# Patient Record
Sex: Female | Born: 1970 | Race: White | Hispanic: No | Marital: Married | Smoking: Former smoker
Health system: Southern US, Community
[De-identification: ages and names within clinical notes are randomized; demographics above are authoritative.]

---

## 1998-10-01 ENCOUNTER — Other Ambulatory Visit: Admission: RE | Admit: 1998-10-01 | Discharge: 1998-10-01 | Payer: Self-pay | Admitting: Family Medicine

## 1999-11-02 ENCOUNTER — Other Ambulatory Visit: Admission: RE | Admit: 1999-11-02 | Discharge: 1999-11-02 | Payer: Self-pay | Admitting: Obstetrics and Gynecology

## 2000-11-09 ENCOUNTER — Other Ambulatory Visit: Admission: RE | Admit: 2000-11-09 | Discharge: 2000-11-09 | Payer: Self-pay | Admitting: Obstetrics and Gynecology

## 2000-11-26 ENCOUNTER — Ambulatory Visit (HOSPITAL_COMMUNITY): Admission: RE | Admit: 2000-11-26 | Discharge: 2000-11-26 | Payer: Self-pay | Admitting: Obstetrics and Gynecology

## 2000-11-26 ENCOUNTER — Encounter (INDEPENDENT_AMBULATORY_CARE_PROVIDER_SITE_OTHER): Payer: Self-pay

## 2001-11-10 ENCOUNTER — Other Ambulatory Visit: Admission: RE | Admit: 2001-11-10 | Discharge: 2001-11-10 | Payer: Self-pay | Admitting: Obstetrics and Gynecology

## 2002-11-21 ENCOUNTER — Other Ambulatory Visit: Admission: RE | Admit: 2002-11-21 | Discharge: 2002-11-21 | Payer: Self-pay | Admitting: Obstetrics and Gynecology

## 2004-02-13 ENCOUNTER — Other Ambulatory Visit: Admission: RE | Admit: 2004-02-13 | Discharge: 2004-02-13 | Payer: Self-pay | Admitting: Obstetrics and Gynecology

## 2004-06-11 ENCOUNTER — Other Ambulatory Visit: Admission: RE | Admit: 2004-06-11 | Discharge: 2004-06-11 | Payer: Self-pay | Admitting: Obstetrics and Gynecology

## 2005-04-19 ENCOUNTER — Other Ambulatory Visit: Admission: RE | Admit: 2005-04-19 | Discharge: 2005-04-19 | Payer: Self-pay | Admitting: Obstetrics and Gynecology

## 2007-12-27 ENCOUNTER — Emergency Department (HOSPITAL_COMMUNITY): Admission: EM | Admit: 2007-12-27 | Discharge: 2007-12-27 | Payer: Self-pay | Admitting: Emergency Medicine

## 2008-01-12 ENCOUNTER — Encounter: Admission: RE | Admit: 2008-01-12 | Discharge: 2008-01-12 | Payer: Self-pay | Admitting: Chiropractic Medicine
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
## 2010-12-18 NOTE — H&P (Signed)
[facility]  Patient:    Grace Watson, Grace Watson                        MRN: [id number] Attending:  Trevor Iha, M.D.                         History and Physical  DATE OF BIRTH:                [birth year]  HISTORY OF PRESENT ILLNESS:   Ms. Latulippe is a 40 year old, G3, P3 with worsening left lower quadrant pain and a history of recurrent left ovarian cyst. Recent ultrasound on November 09, 2000, shows a 4.3 x 3.8 x 4.2 cm ovarian cyst with internal echoes suggestive of an endometrioma. She presents for definitive surgical evaluation and treatment. She has been tried on oral contraceptive agents and has had a tubal ligation back in 1993.  PAST MEDICAL HISTORY:         Negative.  PAST SURGICAL HISTORY:        A tubal ligation in 1993.  CURRENT MEDICATIONS:          Anti-inflammatories as needed.  ALLERGIES:                    No known drug allergies.  PHYSICAL EXAMINATION:  VITAL SIGNS:                  Blood pressure is 100/70.  HEART:                        Regular rate and rhythm.  LUNGS:                        Clear to auscultation bilaterally.  ABDOMEN:                      Nondistended, nontender.  PELVIC:                       Left adnexal fullness and tenderness to palpation. Uterus is anteverted, mobile.  IMPRESSION AND PLAN:          Left ovarian cyst which has been recurrent with left lower quadrant pain and discomfort suggestive of endometrioma. Plan laparoscopy for evaluation of the pelvic pain with CO2 laser standby for probable lysis of adhesions versus endometriosis ablation. If this does appear to involve most of the left ovary and the right ovary appears normal, plan left salpingo-oophorectomy. We will try to preserve the right ovary. Risks and benefits were discussed at length, including, but not limited to, risk of infection, bleeding, damage to bowel or bladder, uterus, tubes, ovaries, possibility of recurrent ovarian  cyst or endometriosis, possibility that this may not alleviate the pain. Risks associated with blood transfusion were also discussed. The patient does give her informed consent.DD:  11/25/00 TD:  11/25/00 Job: [id number]

## 2010-12-18 NOTE — Op Note (Signed)
[facility]  Patient:    Grace Watson, Grace Watson                      MRN: [id number] Proc. Date: 11/26/00 Adm. Date:  [id number] Attending:  Trevor Iha                           Operative Report  PREOPERATIVE DIAGNOSES:       1. Pelvic pain.                               2. Left ovarian cyst.  POSTOPERATIVE DIAGNOSES:      1. Pelvic pain.                               2. Left ovarian cyst.                               3. Right paratubal cyst and adhesions.                               4. Probable left endometrioma.  PROCEDURE:                    1. Laparoscopy with left salpingo-oophorectomy.                               2. Right salpingectomy.                               3. Lysis of adhesions.  SURGEON:                      Trevor Iha, M.D.  ANESTHESIA:                   General endotracheal.  ESTIMATED BLOOD LOSS:         20 cc.  INDICATIONS:                  Ms. Morford is a 40 year old with worsening pelvic pain. Ultrasound consistent with a left ovarian cyst. She has had recurrent left ovarian cyst in the past. Because of persistent pain and left ovarian cyst, she presents today for definitive surgical intervention. She has previously had bilateral tubal ligation and we are planning on removing a left tube and ovary. Risk and benefits were discussed. Informed consent was obtained.  FINDINGS:                     A large left ovarian cyst consistent with an endometrioma, adhesions to the omentum and bowel from the left tube and ovary, right paratubal cyst; otherwise normal appearing uterus, liver. The appendix was not seen.  DESCRIPTION OF PROCEDURE:     After adequate analgesia, the patient was placed in the dorsal lithotomy position. She was sterilely prepped and draped. The bladder was sterilely drained. A Hulka tenaculum is placed on the anterior lip of the cervix. A 1 cm infraumbilical skin incision was made. The  Veress needle was inserted. The abdomen was insufflated to dullness percussion. A  5 mm trocar was inserted to the left of the midline two fingerbreadths above the pubic symphysis under direct visualization. This was converted to a 12 mm trocar under direct visualization. Some bleeding around the trocar site was cauterized with bipolar cautery. At this time, bipolar cautery was used to cauterize the right fallopian tube and mesosalpinx was sharply excised with EndoShears. The adhesions from the left tube and ovary were sharply dissected and Bovie cautery was used to cauterize the omentum and pelvic sidewall from where the adhesions and the left ovarian tubal complex adhesions were dissected. Care was taken to avoid the bowel and pelvic sidewall and blood vessels. At this time, bipolar cautery was used across the infundibulopelvic ligament and across the utero-ovarian ligament. EndoShears were used to sharply excise the left ovary and tube, and bipolar cautery was used across the base of the pedicle to assure hemostasis, and good hemostasis at this time was achieved. At this time, an Endocatch bag was placed through the 12 mm trocar site. The tube and ovary, as well as the right tube, were placed in the bag. At this time, the tube and ovary were not able to pass through the incision and the bad did rupture, and leaked a small amount of endometriosis-appearing fluid into the cul-de-sac. At this time, copious amount of irrigation was applied and after the endometrioma was drained, a second Endocatch bag was placed and was delivered easily through the incision. After a copious amount of irrigation, adequate hemostasis was assured and no residual endometriosis cystic fluid was identified. The trocar was removed. Reexamination of the trocar site revealed good hemostasis. The umbilical ______ trocar was removed as well. The fascia on both incisions were closed with figure-of-eights of 0 Vicryl. The  skin was closed with 3-0 Vicryl Rapide subcuticular stitch. The incisions were injected with 0.25% Marcaine. The tenaculum was removed from the cervix, noted to be hemostatic. The patient tolerated the procedure well and was stable and transferred to recovery room. Sponge, needle, and instrument count was normal x 3.  DISPOSITION:                  The patient was discharged home with routine instructions for laparoscopy. She will follow up in the office in two to three weeks. She is sent home with a prescription for Darvocet, #20. DD:  11/26/00 TD:  11/26/00 Job: [id number]

## 2012-03-22 ENCOUNTER — Ambulatory Visit: Payer: 59

## 2012-03-22 ENCOUNTER — Ambulatory Visit (INDEPENDENT_AMBULATORY_CARE_PROVIDER_SITE_OTHER): Payer: 59 | Admitting: Emergency Medicine

## 2012-03-22 VITALS — BP 120/74 | HR 84 | Temp 98.6°F | Resp 16 | Ht 61.5 in | Wt 153.8 lb

## 2012-03-22 DIAGNOSIS — R079 Chest pain, unspecified: Secondary | ICD-10-CM

## 2012-03-22 DIAGNOSIS — R109 Unspecified abdominal pain: Secondary | ICD-10-CM

## 2012-03-22 LAB — POCT UA - MICROSCOPIC ONLY
Crystals, Ur, HPF, POC: NEGATIVE
Yeast, UA: NEGATIVE

## 2012-03-22 LAB — POCT URINALYSIS DIPSTICK
Bilirubin, UA: NEGATIVE
Glucose, UA: NEGATIVE
Ketones, UA: 15
Spec Grav, UA: 1.01

## 2012-03-22 LAB — POCT CBC
HCT, POC: 46.4 % (ref 37.7–47.9)
Hemoglobin: 14.2 g/dL (ref 12.2–16.2)
MCH, POC: 28.6 pg (ref 27–31.2)
MCV: 93.4 fL (ref 80–97)
MID (cbc): 0.5 (ref 0–0.9)
RBC: 4.97 M/uL (ref 4.04–5.48)
WBC: 9.4 10*3/uL (ref 4.6–10.2)

## 2012-03-22 MED ORDER — MELOXICAM 15 MG PO TABS
15.0000 mg | ORAL_TABLET | Freq: Every day | ORAL | Status: AC
Start: 2012-03-22 — End: 2013-03-22

## 2012-03-22 MED ORDER — CYCLOBENZAPRINE HCL 10 MG PO TABS: ORAL_TABLET | ORAL | Status: AC

## 2012-03-22 NOTE — Patient Instructions (Addendum)
Chest Wall Pain Chest wall pain is pain in or around the bones and muscles of your chest. It may take up to 6 weeks to get better. It may take longer if you must stay physically active in your work and activities.  CAUSES  Chest wall pain may happen on its own. However, it may be caused by:  A viral illness like the flu.   Injury.   Coughing.   Exercise.   Arthritis.   Fibromyalgia.   Shingles.  HOME CARE INSTRUCTIONS   Avoid overtiring physical activity. Try not to strain or perform activities that cause pain. This includes any activities using your chest or your abdominal and side muscles, especially if heavy weights are used.   Put ice on the sore area.   Put ice in a plastic bag.   Place a towel between your skin and the bag.   Leave the ice on for 15 to 20 minutes per hour while awake for the first 2 days.   Only take over-the-counter or prescription medicines for pain, discomfort, or fever as directed by your caregiver.  SEEK IMMEDIATE MEDICAL CARE IF:   Your pain increases, or you are very uncomfortable.   You have a fever.   Your chest pain becomes worse.   You have new, unexplained symptoms.   You have nausea or vomiting.   You feel sweaty or lightheaded.   You have a cough with phlegm (sputum), or you cough up blood.  MAKE SURE YOU:   Understand these instructions.   Will watch your condition.   Will get help right away if you are not doing well or get worse.  Document Released: 07/19/2005 Document Revised: 07/08/2011 Document Reviewed: 03/15/2011 [employer] Patient Information 2012 [employer]. 

## 2012-03-22 NOTE — Progress Notes (Signed)
  Subjective:    Patient ID: Grace Watson, female    DOB: 12/18/1970, 41 y.o.   MRN: [id number]  HPI patient here with a chief complaint of one-week history of pain on her right lateral chest area. She has significant discomfort when she turns or twists. She is not short of breath to she has not had any nausea or vomiting. She denies urinary symptoms. Last menstrual period was 5 days ago and normal.    Review of Systems     Objective:   Physical Exam physical exam neck is supple her chest is clear to auscultation and percussion. There is exquisite tenderness over the lateral ribs on the right. The abdomen is soft is soft and nontender liver and spleen are not enlarged  Results for orders placed in visit on 03/22/12  POCT CBC      Component Value Range   WBC 9.4  4.6 - 10.2 K/uL   Lymph, poc 2.3  0.6 - 3.4   POC LYMPH PERCENT 24.2  10 - 50 %L   MID (cbc) 0.5  0 - 0.9   POC MID % 5.8  0 - 12 %M   POC Granulocyte 6.6  2 - 6.9   Granulocyte percent 70.0  37 - 80 %G   RBC 4.97  4.04 - 5.48 M/uL   Hemoglobin 14.2  12.2 - 16.2 g/dL   HCT, POC 82.9  56.2 - 47.9 %   MCV 93.4  80 - 97 fL   MCH, POC 28.6  27 - 31.2 pg   MCHC 30.6 (*) 31.8 - 35.4 g/dL   RDW, POC 13.0     Platelet Count, POC 378  142 - 424 K/uL   MPV 8.4  0 - 99.8 fL  POCT URINALYSIS DIPSTICK      Component Value Range   Color, UA yellow     Clarity, UA turbid     Glucose, UA neg     Bilirubin, UA neg     Ketones, UA 15     Spec Grav, UA 1.010     Blood, UA mod     pH, UA 5.5     Protein, UA neg     Urobilinogen, UA 0.2     Nitrite, UA neg     Leukocytes, UA Trace    POCT UA - MICROSCOPIC ONLY      Component Value Range   WBC, Ur, HPF, POC 0-2     RBC, urine, microscopic 2-3     Bacteria, U Microscopic 3+     Mucus, UA neg     Epithelial cells, urine per micros 3-10     Crystals, Ur, HPF, POC neg     Casts, Ur, LPF, POC neg     Yeast, UA neg     [facility] reading (PRIMARY) by  Dr. Cleta Alberts no fracture seen no  pneumothorax.       Assessment & Plan:  Patient presents with right lateral chest pain. No rash was seen to suggest shingles. We'll check films of the right ribs as well as a urine CBC

## 2013-01-16 ENCOUNTER — Other Ambulatory Visit: Payer: Self-pay | Admitting: Oncology

## 2013-01-16 NOTE — Progress Notes (Signed)
  i checked w. Grace Watson re Grace Watson's BRCA testing; she tells me:  Grace Watson sent me a database of patients that were seen here from April 2003.  Grace Watson was referred to Grace Watson but declined testing, per this database.  Grace Watson    We will check with the patient next vbisit regarding her current thoughts on testing

## 2016-04-07 ENCOUNTER — Ambulatory Visit (INDEPENDENT_AMBULATORY_CARE_PROVIDER_SITE_OTHER): Payer: 59 | Admitting: Physician Assistant

## 2016-04-07 ENCOUNTER — Encounter: Payer: Self-pay | Admitting: Physician Assistant

## 2016-04-07 VITALS — BP 120/64 | HR 70 | Temp 97.9°F | Resp 16 | Ht 61.0 in | Wt 132.4 lb

## 2016-04-07 DIAGNOSIS — M79662 Pain in left lower leg: Secondary | ICD-10-CM

## 2016-04-07 NOTE — Patient Instructions (Addendum)
You can ice the area three times per day.  I am placing an urgent referral with ortho.  Please await that contact. You can use tylenol for pain, and crutches with ambulation.    IF you received an x-ray today, you will receive an invoice from [facility]. Please contact [facility] at [phone] with questions or concerns regarding your invoice.   IF you received labwork today, you will receive an invoice from [employer]. Please contact [facility] at [phone] with questions or concerns regarding your invoice.   Our billing staff will not be able to assist you with questions regarding bills from these companies.  You will be contacted with the lab results as soon as they are available. The fastest way to get your results is to activate your My Chart account. Instructions are located on the last page of this paperwork. If you have not heard from [place] regarding the results in 2 weeks, please contact this office.

## 2016-04-07 NOTE — Progress Notes (Signed)
[facility] [address] [place] [address] [phone]  Date:  04/07/2016   Name:  Grace Watson   DOB:  September 16, 1970   MRN:  [id number]  PCP:  No PCP Per Patient   By signing my name below, I, Judithe Modest, attest that this documentation has been prepared under the direction and in the presence of Stephanie English, PA-C. Electronically Signed: Judithe Modest, ER Scribe. 03/13/2016. 9:23 AM.  History of Present Illness:  Grace Watson is a 45 y.o. female patient who presents to [facility] complaining of left calf pain after stopping down three steps and having a sudden sharp pain when her foot landed. She states it felt like she pulled something in her calf immediately She denies numbness, tingling, SOB. She endorses gait problem. She has no past hx of blood clots or cancer. She does not smoke.   There are no active problems to display for this patient.   No past medical history on file.  Past Surgical History:  Procedure Laterality Date   CESAREAN SECTION      Social History  Substance Use Topics   Smoking status: Former Smoker    Packs/day: 0.50   Smokeless tobacco: Never Used   Alcohol use Not on file    Family History  Problem Relation Age of Onset   Cancer Mother    Hyperlipidemia Mother    Cancer Father    Hyperlipidemia Father    Diabetes Sister    Cancer Brother     No Known Allergies  Medication list has been reviewed and updated.  Current Outpatient Prescriptions on File Prior to Visit  Medication Sig Dispense Refill   ibuprofen (ADVIL,MOTRIN) 400 MG tablet Take 400 mg by mouth every 6 (six) hours as needed.     cyclobenzaprine (FLEXERIL) 10 MG tablet Take one half to one tablet at bedtime as a muscle relaxant (Patient not taking: Reported on 04/07/2016) 30 tablet 0   No current facility-administered medications on file prior to visit.     Review of Systems  Constitutional: Negative for chills and diaphoresis.   Respiratory: Negative for cough and shortness of breath.   Cardiovascular: Negative for chest pain and leg swelling.  Musculoskeletal: Negative for back pain, falls and joint pain.     Physical Examination: BP 120/64 (BP Location: Left Arm, Patient Position: Sitting, Cuff Size: Normal)    Pulse 70    Temp 97.9 F (36.6 C) (Oral)    Resp 16    Ht 5\' 1"  (1.549 m)    Wt 132 lb 6.4 oz (60.1 kg)    LMP 07/03/2015 Comment: possible menopause   SpO2 99%    BMI 25.02 kg/m  Ideal Body Weight: @FLOWAMB [id number]  Physical Exam  Constitutional: She is oriented to person, place, and time. She appears well-developed and well-nourished. No distress.  HENT:  Head: Normocephalic and atraumatic.  Eyes: Pupils are equal, round, and reactive to light.  Neck: Neck supple.  Cardiovascular: Normal rate.   Pulmonary/Chest: Effort normal. No respiratory distress.  Musculoskeletal: Normal range of motion.  Erythema at the base of the medial malleolus. Nodularity on the distal medial gastrocnemius.   Neurological: She is alert and oriented to person, place, and time. Coordination normal.  Skin: Skin is warm and dry. She is not diaphoretic.  Psychiatric: She has a normal mood and affect. Her behavior is normal.  Nursing note and vitals reviewed.    Assessment and Plan: Grace Watson  is a 45 y.o. female who is here today for calf pain/ Possible muscle tear.  I am placing an urgent referral to ortho Advised tylenol and ice.  Calf pain, left - Plan: AMB referral to orthopedics  Ivar Drape, PA-C [facility] 04/07/2016 9:23 AM  I personally performed the services described in this documentation, which was scribed in my presence. The recorded information has been reviewed and is accurate.

## 2016-04-09 ENCOUNTER — Encounter: Payer: Self-pay | Admitting: Physician Assistant

## 2016-10-01 DIAGNOSIS — I10 Essential (primary) hypertension: Secondary | ICD-10-CM | POA: Diagnosis not present

## 2016-10-01 DIAGNOSIS — D2339 Other benign neoplasm of skin of other parts of face: Secondary | ICD-10-CM | POA: Diagnosis not present

## 2017-03-22 DIAGNOSIS — L989 Disorder of the skin and subcutaneous tissue, unspecified: Secondary | ICD-10-CM | POA: Diagnosis not present

## 2017-03-22 DIAGNOSIS — Z13220 Encounter for screening for lipoid disorders: Secondary | ICD-10-CM | POA: Diagnosis not present

## 2017-03-22 DIAGNOSIS — Z0001 Encounter for general adult medical examination with abnormal findings: Secondary | ICD-10-CM | POA: Diagnosis not present

## 2017-04-01 DIAGNOSIS — S098XXA Other specified injuries of head, initial encounter: Secondary | ICD-10-CM | POA: Diagnosis not present

## 2017-04-01 DIAGNOSIS — S0181XA Laceration without foreign body of other part of head, initial encounter: Secondary | ICD-10-CM | POA: Diagnosis not present

## 2017-04-14 DIAGNOSIS — D225 Melanocytic nevi of trunk: Secondary | ICD-10-CM | POA: Diagnosis not present

## 2017-04-14 DIAGNOSIS — L821 Other seborrheic keratosis: Secondary | ICD-10-CM | POA: Diagnosis not present

## 2017-06-09 DIAGNOSIS — Z01419 Encounter for gynecological examination (general) (routine) without abnormal findings: Secondary | ICD-10-CM | POA: Diagnosis not present

## 2018-04-24 DIAGNOSIS — Z23 Encounter for immunization: Secondary | ICD-10-CM | POA: Diagnosis not present

## 2018-06-12 DIAGNOSIS — Z6825 Body mass index (BMI) 25.0-25.9, adult: Secondary | ICD-10-CM | POA: Diagnosis not present

## 2018-06-12 DIAGNOSIS — Z01419 Encounter for gynecological examination (general) (routine) without abnormal findings: Secondary | ICD-10-CM | POA: Diagnosis not present
